# Patient Record
Sex: Male | Born: 1990 | Race: White | Hispanic: Yes | Marital: Single | Smoking: Light tobacco smoker
Health system: Southern US, Community
[De-identification: ages and names within clinical notes are randomized; demographics above are authoritative.]

## PROBLEM LIST (undated history)

## (undated) HISTORY — PX: APPENDECTOMY: SHX54

## (undated) HISTORY — PX: CHOLECYSTECTOMY: SHX55

---
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
## 2011-01-16 NOTE — Op Note (Signed)
   NAMEELGIN, CARN                            ACCOUNT NO.:  [id number]   MEDICAL RECORD NO.:  [id number]                   PATIENT TYPE:  AMB   LOCATION:  [facility]                                 FACILITY:  [facility]   PHYSICIAN:  Lindaann Slough, M.D.               DATE OF BIRTH:  October 06, 1990   DATE OF PROCEDURE:  12/28/2002  DATE OF DISCHARGE:                                 OPERATIVE REPORT   PREOPERATIVE DIAGNOSIS:  Phimosis.   POSTOPERATIVE DIAGNOSIS:  Phimosis.   PROCEDURE:  Circumcision.   SURGEON:  Lindaann Slough, M.D.   ANESTHESIA:  General.   INDICATIONS:  The patient is a 20 year old male who has been complaining of  irritation of his foreskin.  He was found on physical examination to have  phimosis.  His parents were advised for him to have a circumcision, and they  are willing to proceed and he is scheduled today for the procedure.   DESCRIPTION OF PROCEDURE:  Under general anesthesia, the patient was prepped  and draped and placed in the supine position.  A ventral and a dorsal slit  were done and the foreskin in between those two incisions was excised.  Hemostasis was secured with electrocautery.  Then a skin approximation was  done with 4-0 chromic.  Then a penile block was done with 0.25% Marcaine.   The patient tolerated the procedure well and left the OR in satisfactory  condition to postanesthesia care unit.                                               Lindaann Slough, M.D.    MN/MEDQ  D:  12/28/2002  T:  12/28/2002  Job:  [id number]

## 2013-05-08 ENCOUNTER — Ambulatory Visit: Payer: Self-pay | Admitting: Internal Medicine

## 2013-05-08 DIAGNOSIS — Z0289 Encounter for other administrative examinations: Secondary | ICD-10-CM

## 2013-05-18 ENCOUNTER — Ambulatory Visit (INDEPENDENT_AMBULATORY_CARE_PROVIDER_SITE_OTHER): Payer: BC Managed Care – PPO | Admitting: Internal Medicine

## 2013-05-18 ENCOUNTER — Encounter: Payer: Self-pay | Admitting: Internal Medicine

## 2013-05-18 ENCOUNTER — Other Ambulatory Visit (INDEPENDENT_AMBULATORY_CARE_PROVIDER_SITE_OTHER): Payer: BC Managed Care – PPO

## 2013-05-18 VITALS — BP 110/64 | HR 87 | Temp 98.5°F | Resp 16 | Ht 70.0 in | Wt 246.0 lb

## 2013-05-18 DIAGNOSIS — Z0000 Encounter for general adult medical examination without abnormal findings: Secondary | ICD-10-CM

## 2013-05-18 DIAGNOSIS — Z23 Encounter for immunization: Secondary | ICD-10-CM

## 2013-05-18 LAB — URINALYSIS, ROUTINE W REFLEX MICROSCOPIC
Bilirubin Urine: NEGATIVE
Hgb urine dipstick: NEGATIVE
Ketones, ur: NEGATIVE
Leukocytes, UA: NEGATIVE
Nitrite: NEGATIVE
Urobilinogen, UA: 1 (ref 0.0–1.0)
pH: 6 (ref 5.0–8.0)

## 2013-05-18 LAB — LIPID PANEL
HDL: 30.3 mg/dL — ABNORMAL LOW (ref >39.00)
Triglycerides: 121 mg/dL (ref 0.0–149.0)

## 2013-05-18 LAB — CBC WITH DIFFERENTIAL/PLATELET
Basophils Absolute: 0 10*3/uL (ref 0.0–0.1)
Basophils Relative: 0.3 % (ref 0.0–3.0)
Eosinophils Absolute: 0.2 10*3/uL (ref 0.0–0.7)
Lymphocytes Relative: 18 % (ref 12.0–46.0)
MCHC: 33.5 g/dL (ref 30.0–36.0)
Monocytes Relative: 7.6 % (ref 3.0–12.0)
Neutrophils Relative %: 71.5 % (ref 43.0–77.0)
RBC: 5.24 Mil/uL (ref 4.22–5.81)
RDW: 13.6 % (ref 11.5–14.6)

## 2013-05-18 LAB — COMPREHENSIVE METABOLIC PANEL
AST: 22 U/L (ref 0–37)
Albumin: 4 g/dL (ref 3.5–5.2)
BUN: 15 mg/dL (ref 6–23)
CO2: 28 mEq/L (ref 19–32)
Calcium: 8.8 mg/dL (ref 8.4–10.5)
Chloride: 106 mEq/L (ref 96–112)
GFR: 96.95 mL/min (ref >60.00)
Potassium: 3.9 mEq/L (ref 3.5–5.1)

## 2013-05-18 NOTE — Patient Instructions (Signed)
Health Maintenance, Males A healthy lifestyle and preventative care can promote health and wellness.  Maintain regular health, dental, and eye exams.  Eat a healthy diet. Foods like vegetables, fruits, whole grains, low-fat dairy products, and lean protein foods contain the nutrients you need without too many calories. Decrease your intake of foods high in solid fats, added sugars, and salt. Get information about a proper diet from your caregiver, if necessary.  Regular physical exercise is one of the most important things you can do for your health. Most adults should get at least 150 minutes of moderate-intensity exercise (any activity that increases your heart rate and causes you to sweat) each week. In addition, most adults need muscle-strengthening exercises on 2 or more days a week.   Maintain a healthy weight. The body mass index (BMI) is a screening tool to identify possible weight problems. It provides an estimate of body fat based on height and weight. Your caregiver can help determine your BMI, and can help you achieve or maintain a healthy weight. For adults 20 years and older:  A BMI below 18.5 is considered underweight.  A BMI of 18.5 to 24.9 is normal.  A BMI of 25 to 29.9 is considered overweight.  A BMI of 30 and above is considered obese.  Maintain normal blood lipids and cholesterol by exercising and minimizing your intake of saturated fat. Eat a balanced diet with plenty of fruits and vegetables. Blood tests for lipids and cholesterol should begin at age 20 and be repeated every 5 years. If your lipid or cholesterol levels are high, you are over 50, or you are a high risk for heart disease, you may need your cholesterol levels checked more frequently.Ongoing high lipid and cholesterol levels should be treated with medicines, if diet and exercise are not effective.  If you smoke, find out from your caregiver how to quit. If you do not use tobacco, do not start.  If you  choose to drink alcohol, do not exceed 2 drinks per day. One drink is considered to be 12 ounces (355 mL) of beer, 5 ounces (148 mL) of wine, or 1.5 ounces (44 mL) of liquor.  Avoid use of street drugs. Do not share needles with anyone. Ask for help if you need support or instructions about stopping the use of drugs.  High blood pressure causes heart disease and increases the risk of stroke. Blood pressure should be checked at least every 1 to 2 years. Ongoing high blood pressure should be treated with medicines if weight loss and exercise are not effective.  If you are 45 to 21 years old, ask your caregiver if you should take aspirin to prevent heart disease.  Diabetes screening involves taking a blood sample to check your fasting blood sugar level. This should be done once every 3 years, after age 45, if you are within normal weight and without risk factors for diabetes. Testing should be considered at a younger age or be carried out more frequently if you are overweight and have at least 1 risk factor for diabetes.  Colorectal cancer can be detected and often prevented. Most routine colorectal cancer screening begins at the age of 50 and continues through age 75. However, your caregiver may recommend screening at an earlier age if you have risk factors for colon cancer. On a yearly basis, your caregiver may provide home test kits to check for hidden blood in the stool. Use of a small camera at the end of a tube,   to directly examine the colon (sigmoidoscopy or colonoscopy), can detect the earliest forms of colorectal cancer. Talk to your caregiver about this at age 50, when routine screening begins. Direct examination of the colon should be repeated every 5 to 10 years through age 75, unless early forms of pre-cancerous polyps or small growths are found.  Hepatitis C blood testing is recommended for all people born from 1945 through 1965 and any individual with known risks for hepatitis C.  Healthy  men should no longer receive prostate-specific antigen (PSA) blood tests as part of routine cancer screening. Consult with your caregiver about prostate cancer screening.  Testicular cancer screening is not recommended for adolescents or adult males who have no symptoms. Screening includes self-exam, caregiver exam, and other screening tests. Consult with your caregiver about any symptoms you have or any concerns you have about testicular cancer.  Practice safe sex. Use condoms and avoid high-risk sexual practices to reduce the spread of sexually transmitted infections (STIs).  Use sunscreen with a sun protection factor (SPF) of 30 or greater. Apply sunscreen liberally and repeatedly throughout the day. You should seek shade when your shadow is shorter than you. Protect yourself by wearing long sleeves, pants, a wide-brimmed hat, and sunglasses year round, whenever you are outdoors.  Notify your caregiver of new moles or changes in moles, especially if there is a change in shape or color. Also notify your caregiver if a mole is larger than the size of a pencil eraser.  A one-time screening for abdominal aortic aneurysm (AAA) and surgical repair of large AAAs by sound wave imaging (ultrasonography) is recommended for ages 65 to 75 years who are current or former smokers.  Stay current with your immunizations. Document Released: 02/13/2008 Document Revised: 11/09/2011 Document Reviewed: 01/12/2011 [employer] Patient Information 2014 [employer].  

## 2013-05-19 ENCOUNTER — Encounter: Payer: Self-pay | Admitting: Internal Medicine

## 2013-05-19 NOTE — Progress Notes (Signed)
  Subjective:    Patient ID: Alejandro [name], male    DOB: Dec 19, 1990, 22 y.o.   MRN: [id number]  HPI  New to me for a complete physical, he feels well and offers no complaints.  Review of Systems  Constitutional: Negative.   HENT: Negative.   Eyes: Negative.   Respiratory: Negative.   Cardiovascular: Negative.   Gastrointestinal: Negative.   Endocrine: Negative.   Genitourinary: Negative.   Musculoskeletal: Negative.   Skin: Negative.   Allergic/Immunologic: Negative.   Neurological: Negative.   Hematological: Negative.   Psychiatric/Behavioral: Negative.        Objective:   Physical Exam  Vitals reviewed. Constitutional: He is oriented to person, place, and time. He appears well-developed and well-nourished. No distress.  HENT:  Head: Normocephalic and atraumatic.  Mouth/Throat: Oropharynx is clear and moist. No oropharyngeal exudate.  Eyes: Conjunctivae are normal. Right eye exhibits no discharge. Left eye exhibits no discharge. No scleral icterus.  Neck: Normal range of motion. Neck supple. No JVD present. No tracheal deviation present. No thyromegaly present.  Cardiovascular: Normal rate, regular rhythm, normal heart sounds and intact distal pulses.  Exam reveals no gallop and no friction rub.   No murmur heard. Pulmonary/Chest: Effort normal and breath sounds normal. No stridor. No respiratory distress. He has no wheezes. He has no rales. He exhibits no tenderness.  Abdominal: Soft. Bowel sounds are normal. He exhibits no distension and no mass. There is no tenderness. There is no rebound and no guarding. Hernia confirmed negative in the right inguinal area and confirmed negative in the left inguinal area.  Genitourinary: Testes normal and penis normal. Right testis shows no mass, no swelling and no tenderness. Right testis is descended. Left testis shows no mass, no swelling and no tenderness. Left testis is descended. Circumcised. No penile erythema or penile tenderness. No  discharge found.  Musculoskeletal: Normal range of motion. He exhibits no edema and no tenderness.  Lymphadenopathy:    He has no cervical adenopathy.       Right: No inguinal adenopathy present.       Left: No inguinal adenopathy present.  Neurological: He is oriented to person, place, and time.  Skin: Skin is warm and dry. No rash noted. He is not diaphoretic. No erythema. No pallor.  Psychiatric: He has a normal mood and affect. His behavior is normal. Judgment and thought content normal.          Assessment & Plan:

## 2013-05-19 NOTE — Assessment & Plan Note (Signed)
Exam done Vaccines were updated Labs ordered He will RTC in one month for gardisil #2 Pt ed material was given He was asked to stop smoking

## 2014-03-17 ENCOUNTER — Emergency Department (HOSPITAL_COMMUNITY): Payer: BC Managed Care – PPO

## 2014-03-17 ENCOUNTER — Encounter (HOSPITAL_COMMUNITY): Payer: Self-pay | Admitting: Emergency Medicine

## 2014-03-17 ENCOUNTER — Emergency Department (HOSPITAL_COMMUNITY)
Admission: EM | Admit: 2014-03-17 | Discharge: 2014-03-18 | Disposition: A | Discharge status: Home / Self Care | Payer: Self-pay | Attending: Emergency Medicine | Admitting: Emergency Medicine

## 2014-03-17 DIAGNOSIS — R519 Headache, unspecified: Secondary | ICD-10-CM

## 2014-03-17 DIAGNOSIS — F17200 Nicotine dependence, unspecified, uncomplicated: Secondary | ICD-10-CM | POA: Insufficient documentation

## 2014-03-17 DIAGNOSIS — R51 Headache: Secondary | ICD-10-CM | POA: Insufficient documentation

## 2014-03-17 DIAGNOSIS — R42 Dizziness and giddiness: Secondary | ICD-10-CM | POA: Insufficient documentation

## 2014-03-17 DIAGNOSIS — H538 Other visual disturbances: Secondary | ICD-10-CM | POA: Insufficient documentation

## 2014-03-17 DIAGNOSIS — I10 Essential (primary) hypertension: Secondary | ICD-10-CM | POA: Insufficient documentation

## 2014-03-17 LAB — CBC WITH DIFFERENTIAL/PLATELET
Basophils Absolute: 0 10*3/uL (ref 0.0–0.1)
Basophils Relative: 0 % (ref 0–1)
Eosinophils Absolute: 0.3 10*3/uL (ref 0.0–0.7)
Eosinophils Relative: 3 % (ref 0–5)
HCT: 43 % (ref 39.0–52.0)
HEMOGLOBIN: 14.9 g/dL (ref 13.0–17.0)
LYMPHS ABS: 2.6 10*3/uL (ref 0.7–4.0)
Lymphocytes Relative: 31 % (ref 12–46)
MCH: 29.2 pg (ref 26.0–34.0)
MCHC: 34.7 g/dL (ref 30.0–36.0)
MCV: 84.3 fL (ref 78.0–100.0)
MONOS PCT: 7 % (ref 3–12)
Monocytes Absolute: 0.6 10*3/uL (ref 0.1–1.0)
NEUTROS ABS: 5 10*3/uL (ref 1.7–7.7)
NEUTROS PCT: 59 % (ref 43–77)
PLATELETS: 198 10*3/uL (ref 150–400)
RBC: 5.1 MIL/uL (ref 4.22–5.81)
RDW: 12.9 % (ref 11.5–15.5)
WBC: 8.4 10*3/uL (ref 4.0–10.5)

## 2014-03-17 LAB — URINALYSIS, ROUTINE W REFLEX MICROSCOPIC
Bilirubin Urine: NEGATIVE
GLUCOSE, UA: NEGATIVE mg/dL
HGB URINE DIPSTICK: NEGATIVE
Ketones, ur: NEGATIVE mg/dL
LEUKOCYTES UA: NEGATIVE
Nitrite: NEGATIVE
PROTEIN: NEGATIVE mg/dL
SPECIFIC GRAVITY, URINE: 1.025 (ref 1.005–1.030)
UROBILINOGEN UA: 1 mg/dL (ref 0.0–1.0)
pH: 6.5 (ref 5.0–8.0)

## 2014-03-17 LAB — CBG MONITORING, ED: GLUCOSE-CAPILLARY: 89 mg/dL (ref 70–99)

## 2014-03-17 LAB — I-STAT TROPONIN, ED: TROPONIN I, POC: 0 ng/mL (ref 0.00–0.08)

## 2014-03-17 LAB — BASIC METABOLIC PANEL
ANION GAP: 15 (ref 5–15)
BUN: 16 mg/dL (ref 6–23)
CHLORIDE: 98 meq/L (ref 96–112)
CO2: 26 mEq/L (ref 19–32)
Calcium: 9.2 mg/dL (ref 8.4–10.5)
Creatinine, Ser: 1.22 mg/dL (ref 0.50–1.35)
GFR, EST NON AFRICAN AMERICAN: 84 mL/min — AB (ref >90)
Glucose, Bld: 91 mg/dL (ref 70–99)
POTASSIUM: 4 meq/L (ref 3.7–5.3)
SODIUM: 139 meq/L (ref 137–147)

## 2014-03-17 MED ORDER — SODIUM CHLORIDE 0.9 % IV BOLUS (SEPSIS)
2000.0000 mL | Freq: Once | INTRAVENOUS | Status: AC
  Administered 2014-03-17: 2000 mL via INTRAVENOUS

## 2014-03-17 MED ORDER — DIPHENHYDRAMINE HCL 50 MG/ML IJ SOLN
25.0000 mg | Freq: Once | INTRAMUSCULAR | Status: AC
  Administered 2014-03-17: 25 mg via INTRAVENOUS
  Filled 2014-03-17: qty 1

## 2014-03-17 MED ORDER — KETOROLAC TROMETHAMINE 30 MG/ML IJ SOLN
30.0000 mg | Freq: Once | INTRAMUSCULAR | Status: AC
  Administered 2014-03-17: 30 mg via INTRAVENOUS
  Filled 2014-03-17: qty 1

## 2014-03-17 NOTE — ED Notes (Signed)
Pt's ST segment noted to appear elevated.

## 2014-03-17 NOTE — ED Notes (Signed)
Woke at 1030 with HA. Have had HA all day today. Also reports felt dizzy, blurred vision and recent L eye twitching. (denies: sob, fever, nvd). Seen by MD Tuesday for abd pain and was told at that visit that his BP was high. No meds PTA. abd pain resolved.

## 2014-03-17 NOTE — ED Notes (Signed)
Dr. Bednar at bedside. 

## 2014-03-17 NOTE — ED Provider Notes (Signed)
Medical screening examination/treatment/procedure(s) were conducted as a shared visit with non-physician practitioner(s) and myself.  I personally evaluated the patient during the encounter.   EKG Interpretation None     Muse not working ECG: Sinus rhythm, ventricular rate 65, right axis deviation, diffuse slight ST elevation, no comparison ECG available, suspect baseline ST elevation for Pt without symptoms suggestive of ACS or pericarditis  Woke up this morning with a mild headache that gradually worsened to the course of the day with no sudden headache no confusion no fever no trauma no change in speech or vision swallowing or understanding no focal or lateralizing weakness numbness or incoordination he did have several episodes where he felt as if he had slight eye twitching for about 5 seconds to 10 seconds at a time and things looked slightly shaky at that time without room spinning; he also is no cough no shortness of breath no chest pain no lightheadedness no syncope.  Lungs clear to auscultation unlabored, cardiac exam regular rate and rhythm without murmur, pupils equal, extraocular movements intact, peripheral visual fields full to confrontation, no facial asymmetry noted, no nystagmus noted, no pronator drift in arms or legs, normal light touch in all 4 extremities, normal bilateral finger to nose testing  Doubt ACS, SAH, CVA, SBI.  Hurman HornJohn M Manali Mcelmurry, MD 03/18/14 [phone]

## 2014-03-18 MED ORDER — IBUPROFEN 800 MG PO TABS: 800.0000 mg | ORAL_TABLET | Freq: Three times a day (TID) | ORAL | Status: AC | PRN

## 2014-03-18 NOTE — ED Provider Notes (Signed)
CSN: [id number]     Arrival date & time 03/17/14  2029 History   First MD Initiated Contact with Patient 03/17/14 2214     Chief Complaint  Patient presents with  . Hypertension  . Headache  . Dizziness  . Blurred Vision     (Consider location/radiation/quality/duration/timing/severity/associated sxs/prior Treatment) HPI  Patient presents to the emergency department with mild headache and dizziness that occurred earlier today.  The patient, states, that he has a history of hypertension, but does not take medication.  The patient denies chest pain, shortness of breath, weakness, back pain, neck pain, fever, cough, abdominal pain, nausea, vomiting, diarrhea, dysuria, rash, or syncope.  The patient, states, that he did not take any medications prior to arrival the patient, states nothing seems make his condition, better or worse.  Patient, states symptoms have been fairly constant since this morning. History reviewed. No pertinent past medical history. Past Surgical History  Procedure Laterality Date  . Cholecystectomy    . Appendectomy     Family History  Problem Relation Age of Onset  . Hypertension Paternal Grandmother   . Cancer Neg Hx   . Alcohol abuse Neg Hx   . Drug abuse Neg Hx   . Early death Neg Hx   . Heart disease Neg Hx   . Hyperlipidemia Neg Hx   . Kidney disease Neg Hx   . Stroke Neg Hx    History  Substance Use Topics  . Smoking status: Light Tobacco Smoker -- 2 years    Types: Cigarettes  . Smokeless tobacco: Never Used  . Alcohol Use: 1.2 oz/week    2 Cans of beer per week    Review of Systems  All other systems negative except as documented in the HPI. All pertinent positives and negatives as reviewed in the HPI.  Allergies  Review of patient's allergies indicates no known allergies.  Home Medications   Prior to Admission medications   Medication Sig Start Date End Date Taking? Authorizing Provider  ibuprofen (ADVIL,MOTRIN) 800 MG tablet Take 1  tablet (800 mg total) by mouth every 8 (eight) hours as needed. 03/18/14   Jamesetta Orleans Elara Cocke, PA-C   BP 115/62  Pulse 71  Temp(Src) 98.3 F (36.8 C) (Oral)  Resp 19  Ht 5\' 11"  (1.803 m)  Wt 250 lb (113.399 kg)  BMI 34.88 kg/m2  SpO2 100% Physical Exam  Nursing note and vitals reviewed. Constitutional: He is oriented to person, place, and time. He appears well-developed and well-nourished. No distress.  HENT:  Head: Normocephalic and atraumatic.  Mouth/Throat: Oropharynx is clear and moist.  Eyes: Pupils are equal, round, and reactive to light.  Neck: Normal range of motion. Neck supple.  Cardiovascular: Normal rate, regular rhythm and normal heart sounds.  Exam reveals no gallop and no friction rub.   No murmur heard. Pulmonary/Chest: Effort normal and breath sounds normal. No respiratory distress.  Abdominal: Soft. Bowel sounds are normal. He exhibits no distension. There is no tenderness.  Musculoskeletal: Normal range of motion. He exhibits no edema.  Neurological: He is alert and oriented to person, place, and time. He has normal reflexes. No cranial nerve deficit. He exhibits normal muscle tone. Coordination normal.  Skin: Skin is warm and dry.  Psychiatric: He has a normal mood and affect. His behavior is normal. Judgment and thought content normal.    ED Course  Procedures (including critical care time) Labs Review Labs Reviewed  BASIC METABOLIC PANEL - Abnormal; Notable for the following:  GFR calc non Af Amer 84 (*)    All other components within normal limits  CBC WITH DIFFERENTIAL  URINALYSIS, ROUTINE W REFLEX MICROSCOPIC  CBG MONITORING, ED  I-STAT TROPOININ, ED  I-STAT TROPOININ, ED    Imaging Review Ct Head Wo Contrast  03/18/2014   CLINICAL DATA:  Headache  EXAM: CT HEAD WITHOUT CONTRAST  TECHNIQUE: Contiguous axial images were obtained from the base of the skull through the vertex without intravenous contrast.  COMPARISON:  None.  FINDINGS: There is no  acute intracranial hemorrhage or infarct. No mass lesion or midline shift. Gray-white matter differentiation is well maintained. Ventricles are normal in size without evidence of hydrocephalus. CSF containing spaces are within normal limits. No extra-axial fluid collection.  The calvarium is intact.  Orbital soft tissues are within normal limits.  The paranasal sinuses and mastoid air cells are well pneumatized and free of fluid.  Scalp soft tissues are unremarkable.  IMPRESSION: Normal study with no acute intracranial abnormality identified.   Electronically Signed   By: Rise MuBenjamin  McClintock M.D.   On: 03/18/2014 00:04    Patient does not have any sense of hypertension here in the emergency department.  Patient is advised followup with his primary care Dr. told to return here as needed.   was given results of tests and all questions were answered  Carlyle DollyChristopher W Halford Goetzke, PA-C 03/19/14 [phone]

## 2014-03-18 NOTE — Discharge Instructions (Signed)
Return here as needed.  Followup with your primary care Dr. your testing here today was normal

## 2016-07-31 ENCOUNTER — Encounter (HOSPITAL_COMMUNITY): Payer: Self-pay | Admitting: *Deleted

## 2016-07-31 ENCOUNTER — Emergency Department (HOSPITAL_COMMUNITY)
Admission: EM | Admit: 2016-07-31 | Discharge: 2016-07-31 | Disposition: A | Discharge status: Home / Self Care | Payer: Self-pay | Attending: Physician Assistant | Admitting: Physician Assistant

## 2016-07-31 ENCOUNTER — Emergency Department (HOSPITAL_COMMUNITY): Payer: Self-pay

## 2016-07-31 DIAGNOSIS — Y999 Unspecified external cause status: Secondary | ICD-10-CM | POA: Insufficient documentation

## 2016-07-31 DIAGNOSIS — Y939 Activity, unspecified: Secondary | ICD-10-CM | POA: Insufficient documentation

## 2016-07-31 DIAGNOSIS — Y92410 Unspecified street and highway as the place of occurrence of the external cause: Secondary | ICD-10-CM | POA: Insufficient documentation

## 2016-07-31 DIAGNOSIS — F17210 Nicotine dependence, cigarettes, uncomplicated: Secondary | ICD-10-CM | POA: Insufficient documentation

## 2016-07-31 DIAGNOSIS — S62307A Unspecified fracture of fifth metacarpal bone, left hand, initial encounter for closed fracture: Secondary | ICD-10-CM | POA: Insufficient documentation

## 2016-07-31 MED ORDER — NAPROXEN 500 MG PO TABS: 500.0000 mg | ORAL_TABLET | Freq: Two times a day (BID) | ORAL | 0 refills | Status: AC

## 2016-07-31 MED ORDER — NAPROXEN 500 MG PO TABS
500.0000 mg | ORAL_TABLET | Freq: Once | ORAL | Status: AC
  Administered 2016-07-31: 500 mg via ORAL
  Filled 2016-07-31: qty 1

## 2016-07-31 MED ORDER — HYDROCODONE-ACETAMINOPHEN 5-325 MG PO TABS: 1.0000 | ORAL_TABLET | ORAL | 0 refills | Status: AC | PRN
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
## 2016-07-31 NOTE — Discharge Instructions (Signed)
Please read and follow all provided instructions.  Your diagnoses today include:  1. Closed displaced fracture of fifth metacarpal bone of left hand, unspecified portion of metacarpal, initial encounter    Tests performed today include:  An x-ray of the affected area - shows broken 5th metacarpal bone in your hand  Vital signs. See below for your results today.   Medications prescribed:   Vicodin (hydrocodone/acetaminophen) - narcotic pain medication  DO NOT drive or perform any activities that require you to be awake and alert because this medicine can make you drowsy. BE VERY CAREFUL not to take multiple medicines containing Tylenol (also called acetaminophen). Doing so can lead to an overdose which can damage your liver and cause liver failure and possibly death.   Naproxen - anti-inflammatory pain medication  Do not exceed 500mg  naproxen every 12 hours, take with food  You have been prescribed an anti-inflammatory medication or NSAID. Take with food. Take smallest effective dose for the shortest duration needed for your pain. Stop taking if you experience stomach pain or vomiting.   Take any prescribed medications only as directed.  Home care instructions:   Follow any educational materials contained in this packet  Follow R.I.C.E. Protocol:  R - rest your injury   I  - use ice on injury without applying directly to skin  C - compress injury with bandage or splint  E - elevate the injury as much as possible  Follow-up instructions: Dr. Amanda PeaGramig wants you to go [facility] on Tuesday 12/5 at 3pm so that him or his PA can see you and switch out your splint to a cast.   Note to ED staff: Please page Dr. Amanda PeaGramig on patient's arrival so that patient can be seen in ED by them.   Return instructions:   Please return if your fingers are numb or tingling, appear gray or blue, or you have severe pain (also elevate the arm and loosen splint or wrap if you  were given one)  Please return to the Emergency Department if you experience worsening symptoms.   Please return if you have any other emergent concerns.  Additional Information:  Your vital signs today were: BP 127/78 (BP Location: Right Arm)    Pulse 79    Temp 98 F (36.7 C) (Oral)    Resp 20    Ht 5\' 11"  (1.803 m)    Wt 113.4 kg    SpO2 96%    BMI 34.87 kg/m  If your blood pressure (BP) was elevated above 135/85 this visit, please have this repeated by your doctor within one month. --------------
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
## 2016-07-31 NOTE — ED Notes (Signed)
Ortho Tech called for splint application

## 2016-08-04 ENCOUNTER — Emergency Department (HOSPITAL_COMMUNITY)
Admission: EM | Admit: 2016-08-04 | Discharge: 2016-08-04 | Disposition: A | Discharge status: Home / Self Care | Payer: Self-pay | Attending: Emergency Medicine | Admitting: Emergency Medicine

## 2016-08-04 ENCOUNTER — Encounter (HOSPITAL_COMMUNITY): Payer: Self-pay | Admitting: Emergency Medicine

## 2016-08-04 DIAGNOSIS — F17210 Nicotine dependence, cigarettes, uncomplicated: Secondary | ICD-10-CM | POA: Insufficient documentation

## 2016-08-04 DIAGNOSIS — S62337D Displaced fracture of neck of fifth metacarpal bone, left hand, subsequent encounter for fracture with routine healing: Secondary | ICD-10-CM | POA: Insufficient documentation
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
## 2016-08-04 NOTE — ED Triage Notes (Addendum)
Secretary paging Dr. Amanda PeaGramig
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
## 2016-08-04 NOTE — ED Notes (Signed)
Patient Alert and oriented X4. Stable and ambulatory. Patient verbalized understanding of the discharge instructions.  Patient belongings were taken by the patient.  
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
## 2016-08-04 NOTE — Progress Notes (Signed)
Orthopedic Tech Progress Note Patient Details:  Alejandro [name] 09/18/1990 [id number]  Casting Type of Cast: Short arm cast Cast Location: LUE Cast Material: Fiberglass Cast Intervention: Application     Jennye MoccasinHughes, Alejandro [name] 08/04/2016, 5:39 PM
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
